# Patient Record
Sex: Female | Born: 1950 | Race: White | Hispanic: No | Smoking: Never smoker
Health system: Southern US, Community
[De-identification: ages and names within clinical notes are randomized; demographics above are authoritative.]

---

## 2008-12-17 ENCOUNTER — Emergency Department (HOSPITAL_BASED_OUTPATIENT_CLINIC_OR_DEPARTMENT_OTHER): Admission: EM | Admit: 2008-12-17 | Discharge: 2008-12-17 | Payer: Self-pay | Admitting: Emergency Medicine

## 2008-12-30 ENCOUNTER — Ambulatory Visit: Payer: Self-pay | Admitting: Radiology

## 2008-12-30 ENCOUNTER — Emergency Department (HOSPITAL_BASED_OUTPATIENT_CLINIC_OR_DEPARTMENT_OTHER): Admission: EM | Admit: 2008-12-30 | Discharge: 2008-12-31 | Payer: Self-pay | Admitting: Emergency Medicine

## 2009-02-10 ENCOUNTER — Emergency Department (HOSPITAL_BASED_OUTPATIENT_CLINIC_OR_DEPARTMENT_OTHER): Admission: EM | Admit: 2009-02-10 | Discharge: 2009-02-10 | Payer: Self-pay | Admitting: Emergency Medicine

## 2009-10-15 IMAGING — CT CT ABDOMEN W/ CM
2 of 5 series · 16 of 46 positions shown, 18 images · IV contrast (APPLIED)
Comparison: None

CT ABDOMEN

CLINICAL DATA: Abdominal pain

CT ABDOMEN AND PELVIS WITH CONTRAST
TECHNIQUE: Multidetector CT imaging of the abdomen and pelvis was
performed using the standard protocol following bolus
administration of intravenous contrast.
Contrast: 80 ml [id number]

[Series 2: abd/pelvis 5.0 b31f · axial · 0.62mm/px · z∈[-439,-29]mm · 13 of 94 slices shown, 15 images]
[im 6/94  soft-tissue]
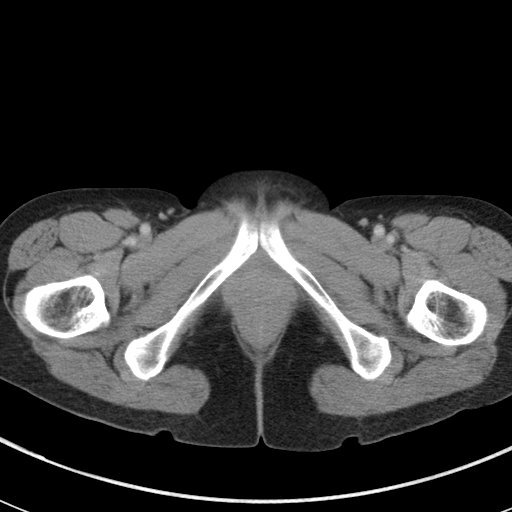
[im 6/94  bone]
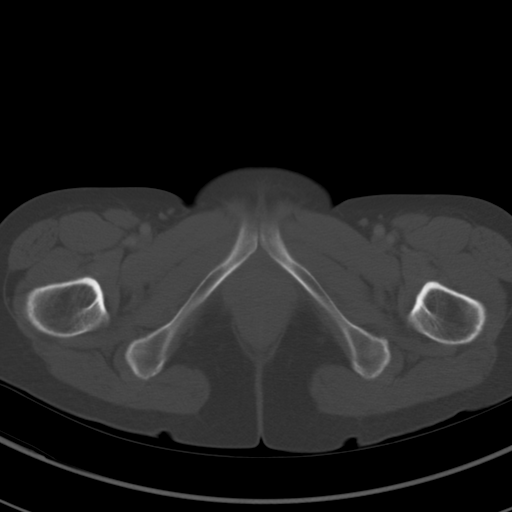
[im 12/94  soft-tissue]
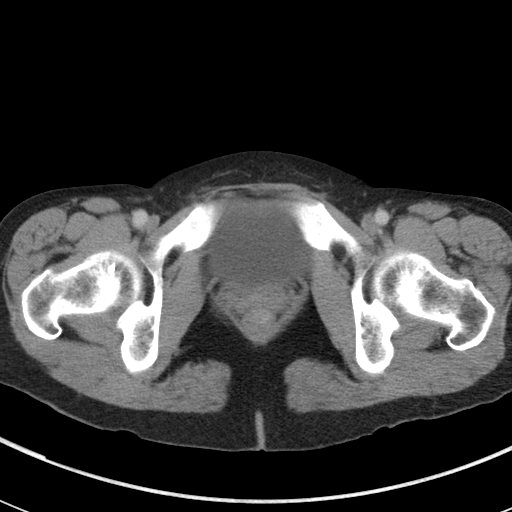
[im 18/94  soft-tissue]
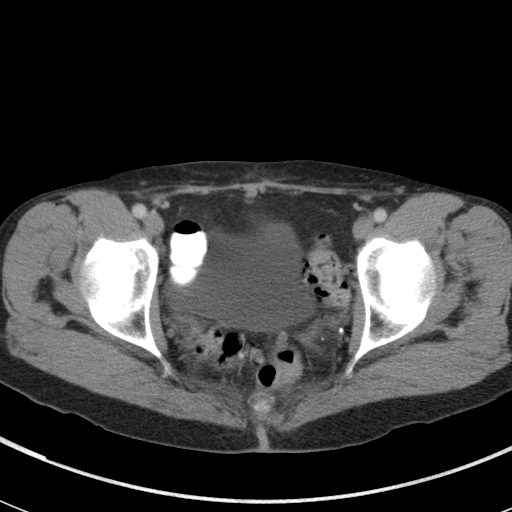
[im 30/94  soft-tissue]
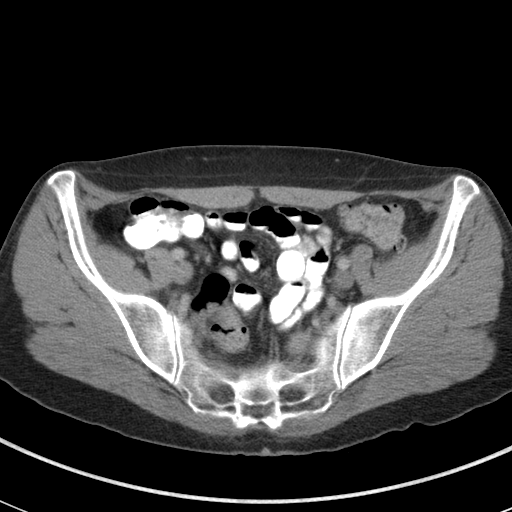
[im 35/94  soft-tissue]
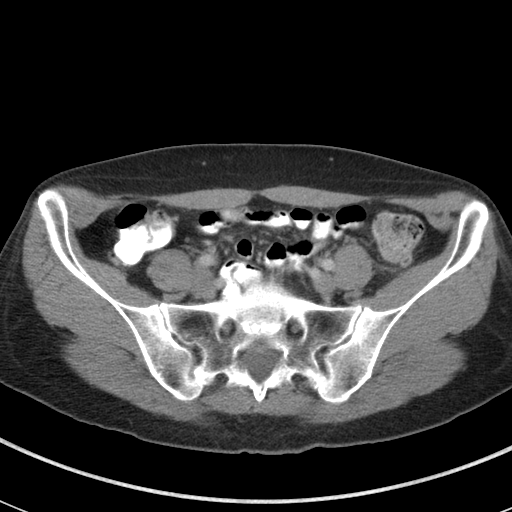
[im 41/94  soft-tissue]
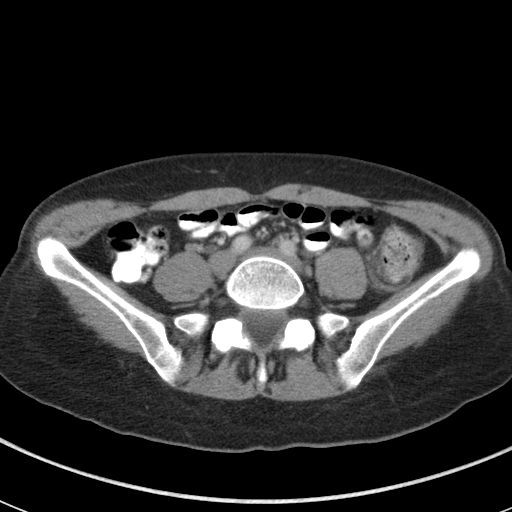
[im 47/94  soft-tissue]
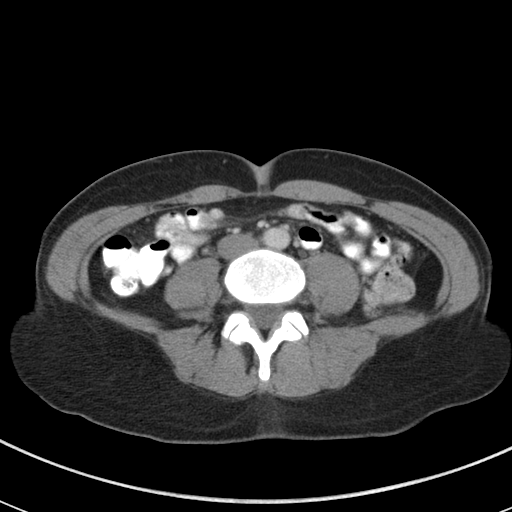
[im 53/94  soft-tissue]
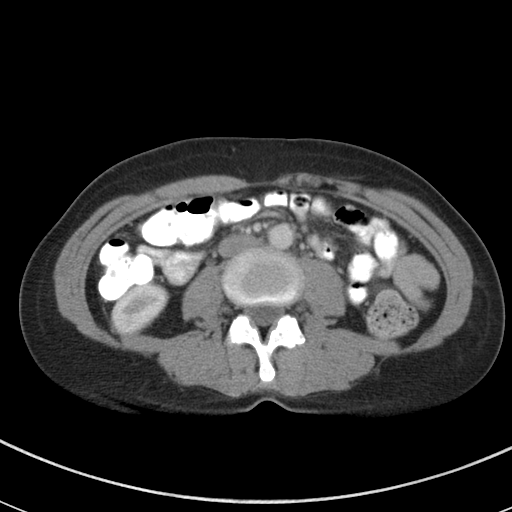
[im 59/94  soft-tissue]
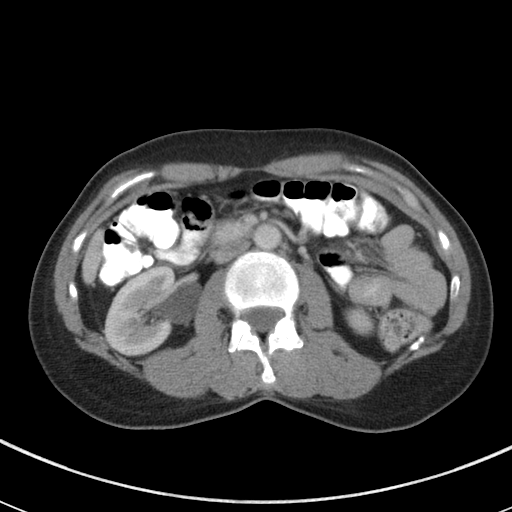
[im 59/94  bone]
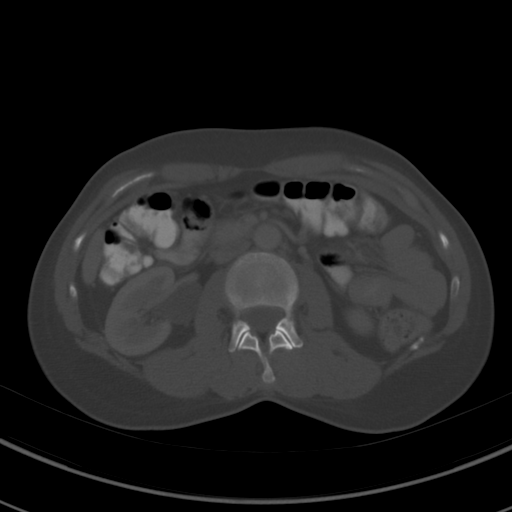
[im 64/94  soft-tissue]
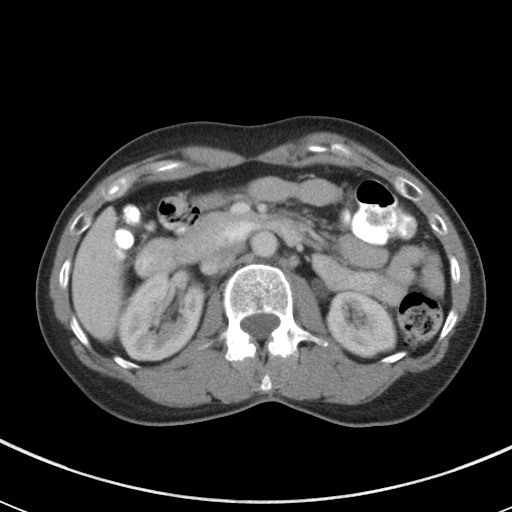
[im 76/94  soft-tissue]
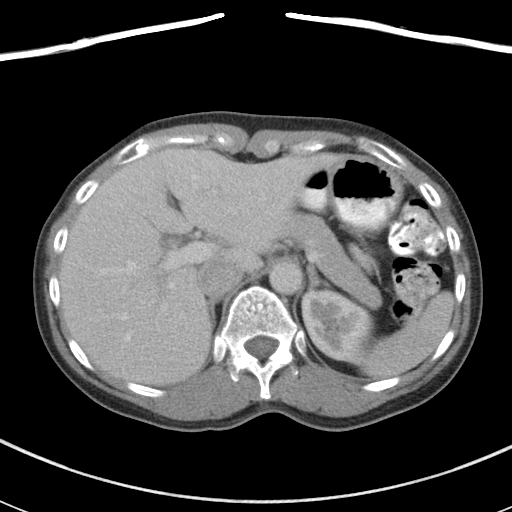
[im 82/94  soft-tissue]
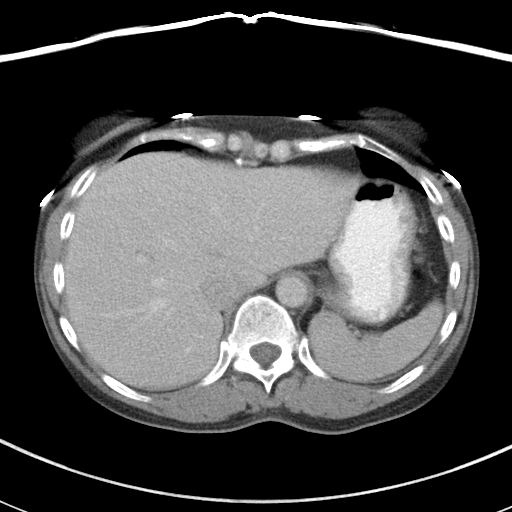
[im 88/94  soft-tissue]
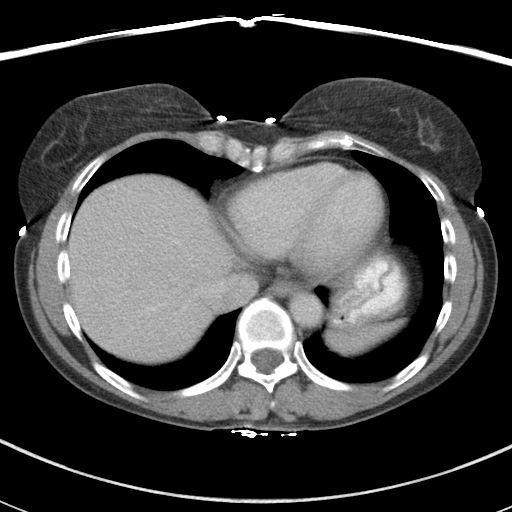

[Series 5: abd/pelvis 3.0 coronal · coronal · 0.68mm/px · 3 of 81 slices shown]
[im 27/81  soft-tissue]
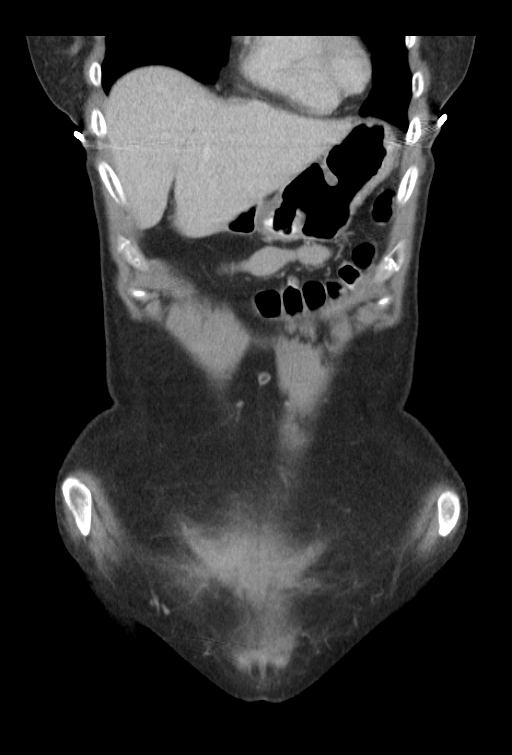
[im 36/81  soft-tissue]
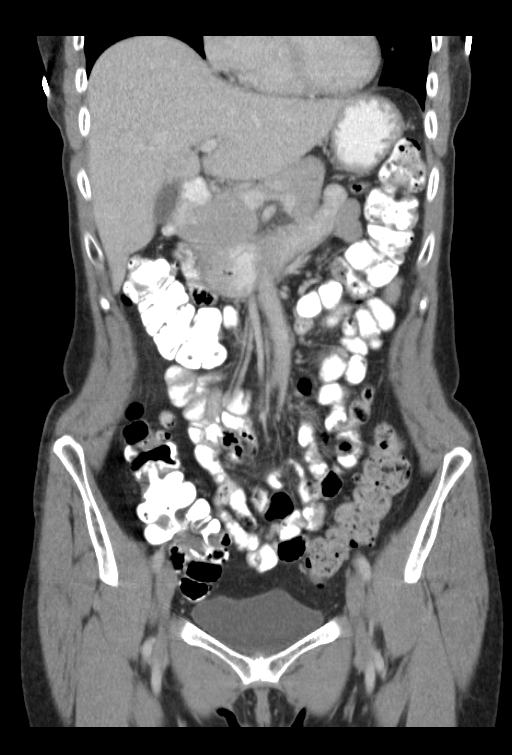
[im 45/81  soft-tissue]
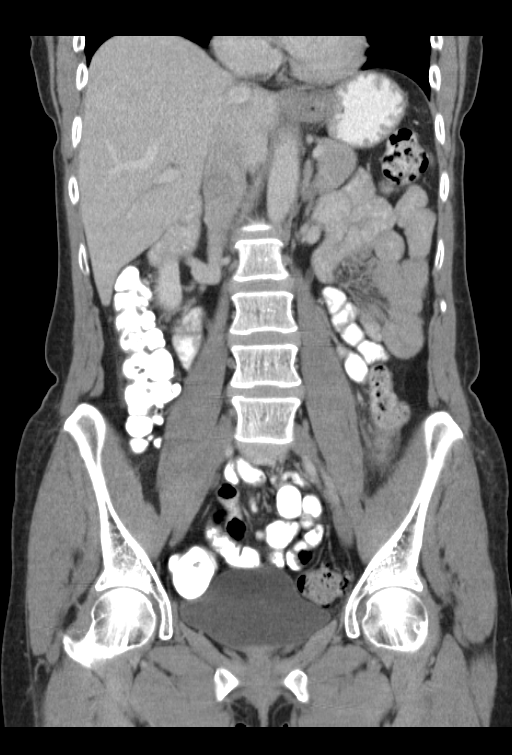

[16 of 46 positions shown; findings below may reference images not displayed]

FINDINGS: Dependent basilar subsegmental atelectasis.  No
pericardial or pleural effusions.

The liver, spleen, pancreas, adrenal glands and kidneys are normal.
The gallbladder is unremarkable.  The mesentery and retroperitoneum
are negative for adenopathy.  The aorta is normal.  Scattered
colonic diverticula changes involve the descending colon.  There is
mild pericolonic mesenteric fatty stranding attributed to
uncomplicated diverticulitis.  The appendix is visualized and
opacifies with contrast which is normal appearance.
IMPRESSION: Uncomplicated diverticulitis involving the descending colon.

CT PELVIS
FINDINGS: Sigmoid colonic diverticula changes.  No acute
inflammation.  No free fluid within the pelvis.  The bladder is
unremarkable.  No free fluid.  The appendix is normal.
IMPRESSION: Diverticula changes without acute inflammation.

## 2011-01-01 LAB — DIFFERENTIAL
Basophils Relative: 0 % (ref 0–1)
Lymphs Abs: 1.5 10*3/uL (ref 0.7–4.0)
Monocytes Absolute: 0.8 10*3/uL (ref 0.1–1.0)
Monocytes Relative: 11 % (ref 3–12)
Neutro Abs: 5 10*3/uL (ref 1.7–7.7)

## 2011-01-01 LAB — CBC
Hemoglobin: 11.4 g/dL — ABNORMAL LOW (ref 12.0–15.0)
MCHC: 33.5 g/dL (ref 30.0–36.0)
MCV: 88.3 fL (ref 78.0–100.0)
RBC: 3.86 MIL/uL — ABNORMAL LOW (ref 3.87–5.11)

## 2011-01-02 LAB — BASIC METABOLIC PANEL
Calcium: 9 mg/dL (ref 8.4–10.5)
Creatinine, Ser: 0.9 mg/dL (ref 0.4–1.2)
GFR calc Af Amer: >60 mL/min (ref >60)
GFR calc non Af Amer: >60 mL/min (ref >60)
Sodium: 142 mEq/L (ref 135–145)

## 2011-01-02 LAB — URINALYSIS, ROUTINE W REFLEX MICROSCOPIC
Bilirubin Urine: NEGATIVE
Glucose, UA: NEGATIVE mg/dL
Specific Gravity, Urine: 1.004 — ABNORMAL LOW (ref 1.005–1.030)
Urobilinogen, UA: 0.2 mg/dL (ref 0.0–1.0)
pH: 7 (ref 5.0–8.0)

## 2011-01-02 LAB — URINE MICROSCOPIC-ADD ON

## 2011-01-02 LAB — DIFFERENTIAL
Lymphocytes Relative: 34 % (ref 12–46)
Monocytes Absolute: 0.8 10*3/uL (ref 0.1–1.0)
Monocytes Relative: 10 % (ref 3–12)
Neutro Abs: 4 10*3/uL (ref 1.7–7.7)
Neutrophils Relative %: 53 % (ref 43–77)

## 2011-01-02 LAB — CBC
Hemoglobin: 12.8 g/dL (ref 12.0–15.0)
RBC: 4.19 MIL/uL (ref 3.87–5.11)
WBC: 7.7 10*3/uL (ref 4.0–10.5)

## 2011-01-03 LAB — URINALYSIS, ROUTINE W REFLEX MICROSCOPIC
Bilirubin Urine: NEGATIVE
Hgb urine dipstick: NEGATIVE
Nitrite: NEGATIVE
Specific Gravity, Urine: 1.004 — ABNORMAL LOW (ref 1.005–1.030)
Urobilinogen, UA: 0.2 mg/dL (ref 0.0–1.0)
pH: 6 (ref 5.0–8.0)

## 2012-07-24 DIAGNOSIS — M810 Age-related osteoporosis without current pathological fracture: Secondary | ICD-10-CM | POA: Insufficient documentation

## 2012-07-24 DIAGNOSIS — E559 Vitamin D deficiency, unspecified: Secondary | ICD-10-CM | POA: Insufficient documentation

## 2017-12-28 ENCOUNTER — Emergency Department (HOSPITAL_COMMUNITY)
Admission: EM | Admit: 2017-12-28 | Discharge: 2017-12-28 | Discharge status: Absent without leave | Payer: Medicare Other

## 2018-01-19 DIAGNOSIS — K5792 Diverticulitis of intestine, part unspecified, without perforation or abscess without bleeding: Secondary | ICD-10-CM | POA: Insufficient documentation

## 2018-04-07 DIAGNOSIS — K582 Mixed irritable bowel syndrome: Secondary | ICD-10-CM | POA: Insufficient documentation

## 2019-04-14 ENCOUNTER — Ambulatory Visit (INDEPENDENT_AMBULATORY_CARE_PROVIDER_SITE_OTHER): Payer: Medicare Other | Admitting: Podiatry

## 2019-04-14 ENCOUNTER — Other Ambulatory Visit: Payer: Self-pay

## 2019-04-14 ENCOUNTER — Encounter: Payer: Self-pay | Admitting: Podiatry

## 2019-04-14 ENCOUNTER — Ambulatory Visit: Payer: Medicare Other

## 2019-04-14 ENCOUNTER — Ambulatory Visit (INDEPENDENT_AMBULATORY_CARE_PROVIDER_SITE_OTHER): Payer: Medicare Other

## 2019-04-14 ENCOUNTER — Other Ambulatory Visit: Payer: Self-pay | Admitting: Podiatry

## 2019-04-14 VITALS — BP 100/61 | HR 64 | Temp 97.1°F | Resp 16

## 2019-04-14 DIAGNOSIS — M216X9 Other acquired deformities of unspecified foot: Secondary | ICD-10-CM | POA: Diagnosis not present

## 2019-04-14 DIAGNOSIS — M778 Other enthesopathies, not elsewhere classified: Secondary | ICD-10-CM

## 2019-04-14 DIAGNOSIS — M79672 Pain in left foot: Secondary | ICD-10-CM | POA: Diagnosis not present

## 2019-04-14 DIAGNOSIS — Q6672 Congenital pes cavus, left foot: Secondary | ICD-10-CM

## 2019-04-14 DIAGNOSIS — M779 Enthesopathy, unspecified: Secondary | ICD-10-CM

## 2019-04-14 DIAGNOSIS — L84 Corns and callosities: Secondary | ICD-10-CM

## 2019-04-14 NOTE — Progress Notes (Signed)
Subjective:   Patient ID: Melinda Espinoza, female   DOB: 68 y.o.   MRN: [id number]   HPI Patient presents with a lot of pain underneath the left foot and states that this is been going on now for about 4 years and no one is been able to help her and she has had orthotics made.  Patient does not smoke likes to be active   Review of Systems  All other systems reviewed and are negative.       Objective:  Physical Exam Vitals signs and nursing note reviewed.  Constitutional:      Appearance: She is well-developed.  Pulmonary:     Effort: Pulmonary effort is normal.  Musculoskeletal: Normal range of motion.  Skin:    General: Skin is warm.  Neurological:     Mental Status: She is alert.     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

## 2019-04-14 NOTE — Progress Notes (Signed)
   Subjective:    Patient ID: Melinda Espinoza, female    DOB: [birth year], 68 y.o.   MRN: [id number]  HPI    Review of Systems  All other systems reviewed and are negative.      Objective:   Physical Exam        Assessment & Plan:
